# Patient Record
Sex: Female | Born: 1990 | Race: Black or African American | Hispanic: No | Marital: Married | Smoking: Never smoker
Health system: Southern US, Community
[De-identification: ages and names within clinical notes are randomized; demographics above are authoritative.]

## PROBLEM LIST (undated history)

## (undated) ENCOUNTER — Emergency Department (HOSPITAL_COMMUNITY): Discharge status: Absent without leave | Payer: BC Managed Care – PPO

## (undated) DIAGNOSIS — B019 Varicella without complication: Secondary | ICD-10-CM

## (undated) DIAGNOSIS — R011 Cardiac murmur, unspecified: Secondary | ICD-10-CM

## (undated) DIAGNOSIS — J45909 Unspecified asthma, uncomplicated: Secondary | ICD-10-CM

## (undated) DIAGNOSIS — T7840XA Allergy, unspecified, initial encounter: Secondary | ICD-10-CM

## (undated) HISTORY — PX: WISDOM TOOTH EXTRACTION: SHX21

## (undated) HISTORY — PX: TONSILLECTOMY AND ADENOIDECTOMY: SUR1326

## (undated) HISTORY — DX: Cardiac murmur, unspecified: R01.1

## (undated) HISTORY — DX: Allergy, unspecified, initial encounter: T78.40XA

## (undated) HISTORY — DX: Unspecified asthma, uncomplicated: J45.909

## (undated) HISTORY — DX: Varicella without complication: B01.9

---
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
## 2013-10-03 ENCOUNTER — Ambulatory Visit (INDEPENDENT_AMBULATORY_CARE_PROVIDER_SITE_OTHER): Payer: BC Managed Care – PPO | Admitting: Internal Medicine

## 2013-10-03 ENCOUNTER — Encounter: Payer: Self-pay | Admitting: Internal Medicine

## 2013-10-03 VITALS — BP 118/80 | Temp 98.4°F | Ht 62.0 in | Wt 174.0 lb

## 2013-10-03 DIAGNOSIS — M79609 Pain in unspecified limb: Secondary | ICD-10-CM

## 2013-10-03 DIAGNOSIS — M79606 Pain in leg, unspecified: Secondary | ICD-10-CM | POA: Insufficient documentation

## 2013-10-03 DIAGNOSIS — R51 Headache: Secondary | ICD-10-CM

## 2013-10-03 DIAGNOSIS — M25519 Pain in unspecified shoulder: Secondary | ICD-10-CM

## 2013-10-03 DIAGNOSIS — M25512 Pain in left shoulder: Secondary | ICD-10-CM

## 2013-10-03 DIAGNOSIS — R519 Headache, unspecified: Secondary | ICD-10-CM

## 2013-10-03 DIAGNOSIS — Z6831 Body mass index (BMI) 31.0-31.9, adult: Secondary | ICD-10-CM

## 2013-10-03 LAB — CBC WITH DIFFERENTIAL/PLATELET
Basophils Absolute: 0 10*3/uL (ref 0.0–0.1)
Basophils Relative: 0.3 % (ref 0.0–3.0)
Eosinophils Absolute: 0.1 10*3/uL (ref 0.0–0.7)
Eosinophils Relative: 1.7 % (ref 0.0–5.0)
HCT: 42.3 % (ref 36.0–46.0)
Hemoglobin: 13.9 g/dL (ref 12.0–15.0)
Lymphocytes Relative: 29.7 % (ref 12.0–46.0)
Lymphs Abs: 2.4 10*3/uL (ref 0.7–4.0)
MCHC: 33 g/dL (ref 30.0–36.0)
MCV: 92.8 fl (ref 78.0–100.0)
Monocytes Absolute: 0.4 10*3/uL (ref 0.1–1.0)
Monocytes Relative: 5 % (ref 3.0–12.0)
Neutro Abs: 5.1 10*3/uL (ref 1.4–7.7)
Neutrophils Relative %: 63.3 % (ref 43.0–77.0)
Platelets: 258 10*3/uL (ref 150.0–400.0)
RBC: 4.56 Mil/uL (ref 3.87–5.11)
RDW: 13.2 % (ref 11.5–15.5)
WBC: 8.1 10*3/uL (ref 4.0–10.5)

## 2013-10-03 LAB — HEPATIC FUNCTION PANEL
ALT: 12 U/L (ref 0–35)
AST: 23 U/L (ref 0–37)
Albumin: 4 g/dL (ref 3.5–5.2)
Alkaline Phosphatase: 63 U/L (ref 39–117)
Bilirubin, Direct: 0.1 mg/dL (ref 0.0–0.3)
Total Bilirubin: 0.6 mg/dL (ref 0.2–1.2)
Total Protein: 7.1 g/dL (ref 6.0–8.3)

## 2013-10-03 LAB — BASIC METABOLIC PANEL
BUN: 9 mg/dL (ref 6–23)
CO2: 28 mEq/L (ref 19–32)
Calcium: 9.8 mg/dL (ref 8.4–10.5)
Chloride: 105 mEq/L (ref 96–112)
Creatinine, Ser: 0.9 mg/dL (ref 0.4–1.2)
GFR: 105.09 mL/min (ref >60.00)
Glucose, Bld: 87 mg/dL (ref 70–99)
Potassium: 4.4 mEq/L (ref 3.5–5.1)
Sodium: 137 mEq/L (ref 135–145)

## 2013-10-03 LAB — LIPID PANEL
Cholesterol: 139 mg/dL (ref 0–200)
HDL: 43.5 mg/dL (ref >39.00)
LDL Cholesterol: 89 mg/dL (ref 0–99)
NONHDL: 95.5
Total CHOL/HDL Ratio: 3
Triglycerides: 33 mg/dL (ref 0.0–149.0)
VLDL: 6.6 mg/dL (ref 0.0–40.0)

## 2013-10-03 LAB — CK: Total CK: 174 U/L (ref 7–177)

## 2013-10-03 LAB — VITAMIN D 25 HYDROXY (VIT D DEFICIENCY, FRACTURES): VITD: 30.04 ng/mL (ref 30.00–100.00)

## 2013-10-03 LAB — HEMOGLOBIN A1C: Hgb A1c MFr Bld: 5.4 % (ref 4.6–6.5)

## 2013-10-03 NOTE — Progress Notes (Signed)
Pre visit review using our clinic review tool, if applicable. No additional management support is needed unless otherwise documented below in the visit note.   Chief Complaint  Patient presents with  . Leg Pain    Started 2 weeks ago.  Has seen another physician for this problem.  Has had labs, x-rays and all were normal.  Was told she needed to loose weight.  . Arm Pain  . Head Pain    HPI: [name] is a 23 year old daughter of one of our patients who hasn't been seen in over 4 years who comes in today for an acute problem. She's been living in [place] and attending [employer] and graduated in May. She's had medical care in [place]. She is currently moved back to [facility] and is to begin loss:  Central in the fall has an apartment in [place]. She states she's been in generally good health until about 2 weeks ago when she began to have a deep random aching pain like a bruise in her left thigh. States it moves up and down into her buttocks area. It is worse after sitting long periods of time and getting up and down. It is better with walking and exercising she is now beginning to have some in her right she is also had some left arm pain up near her shoulder .she is left handed dominant. Denies a specific injury  She saw a physician in [place]  Get thyroid tests an x-ray of the leg and it was felt that she had pain that would get better if she lost some weight. Since then she's developed what she calls a head pain not a headache random better with holding her head no vision changes sudden seconds of sharp pains with no associated symptoms.  Mom is worried because husband has MS there is no weakness numbness falling. Mom wants a vitamin D level done patient wants to know why she has this now and hasn't had before. Thyroid and xray negative.  Is using [employer]   Exercise 3 x per week.  Treadmill for 20 minutes . 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 ROS: See pertinent positives and negatives per HPI. No chest pain shortness of breath vision changes hearing changes. Neg gi gu otherwise  No tobacco   Past Medical History  Diagnosis Date  . Allergy   . Chicken pox   . Heart murmur   . Asthma     Family History  Problem Relation Age of Onset  . Multiple sclerosis Father   . Cancer Father     Leukemia and Prostate  . Hypertension Maternal Grandmother   . Alcohol abuse Maternal Grandfather     History   Social History  . Marital Status: Married    Spouse Name: N/A    Number of Children: N/A  . Years of Education: N/A   Social History Main Topics  . Smoking status: Never Smoker   . Smokeless tobacco: Not on file  . Alcohol Use: No  . Drug Use: No  . Sexual Activity: No   Other Topics Concern  . 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    EXAM:  BP 118/80  Temp(Src) 98.4 F (36.9 C) (Oral)  Ht 5\' 2"  (1.575 m)  Wt 174 lb (78.926 kg)  BMI 31.82 kg/m2  Body mass index is 31.82 kg/(m^2).  GENERAL: vitals reviewed and listed above, alert, oriented, appears well hydrated and in no acute distress she looks well HEENT: atraumatic, conjunctiva  clear, no obvious abnormalities on inspection of external nose and ears EOMs full PERRLA OP : no lesion edema or exudate  NECK: no obvious masses on inspection palpation no adenopathy LUNGS: clear to auscultation bilaterally, no wheezes, rales or rhonchi, good air movement CV: HRRR, no gallops I hear no murmurs no clubbing cyanosis or  peripheral edema nl cap refill  MS: moves all extremities without noticeable focal   abnormality no point tenderness PSYCH:  no obvious depression or anxiety Extremities no acute findings some tenderness over the left shoulder rotator cuff but good range of motion and good strength Neurologic cranial nerves III through XII appear intact no focal deficits gait is within normal limits good range of motion of back she does have a lordosis when she stands no focal weakness Skin no acute findings  ASSESSMENT AND PLAN:  Discussed the following assessment and plan:  Leg pain, anterior, unspecified laterality - Left more than right worse with sitting better with exercise - Plan: CK, Basic metabolic panel, CBC with Differential, Hemoglobin A1c, Hepatic function panel, Vit D  25 hydroxy (rtn osteoporosis monitoring), Lipid panel, CANCELED: Lipase  Left shoulder pain - Probable tendinitis is left-handed dominant - Plan: CK, Basic metabolic panel, CBC with Differential, Hemoglobin A1c, Hepatic function panel, Vit D  25 hydroxy (rtn osteoporosis monitoring), Lipid panel, CANCELED: Lipase  Head pain - Sounds like extra cranial with no other alarm symptoms  BMI 31.0-31.9,adult I think she could have femoral  cutaneous nerve syndrome compression ;today she is wearing skinny jeans but says she doesn't wear them very often but told her that any compression could cause this syndrome especially since it is worse when she's sitting alternatively it could be radiating from her back but is still sounds mechanical. At this time no alarm features however because only thyroid tests were done by report we can do blood sugar lipids anemia check. Advise exercise at this time and continue with her efforts to be healthy and lose weight back hygiene important if this is persistent and progressive she should followup here or elsewhere perhaps sports medicine in regard to her discomfort. At this time I do not think she has a neurologic or inflammatory rheumatic disease. -Patient advised to return or notify health  care team  if symptoms worsen ,persist or new concerns arise.  Patient Instructions  This acts  Like muscular skeletal pain  . And or  Lateral cutaneous nerve compression possible or radiating from back Either way  Advise exercise  Upright avoid prolonged sitting  And heavy lifting.  Will notify you  of labs when available. No evidence at this time of systemic disease process.  [plan fu if  persistent or progressive and not improving over the next months  Or so    Back Exercises These exercises may help you when beginning to rehabilitate your injury. Your symptoms may resolve with or without further involvement from your physician, physical therapist or athletic trainer. While completing these exercises, remember:   Restoring tissue flexibility  helps normal motion to return to the joints. This allows healthier, less painful movement and activity.  An effective stretch should be held for at least 30 seconds.  A stretch should never be painful. You should only feel a gentle lengthening or release in the stretched tissue. STRETCH - Extension, Prone on Elbows   Lie on your stomach on the floor, a bed will be too soft. Place your palms about shoulder width apart and at the height of your head.  Place your elbows under your shoulders. If this is too painful, stack pillows under your chest.  Allow your body to relax so that your hips drop lower and make contact more completely with the floor.  Hold this position for __________ seconds.  Slowly return to lying flat on the floor. Repeat __________ times. Complete this exercise __________ times per day.  RANGE OF MOTION - Extension, Prone Press Ups   Lie on your stomach on the floor, a bed will be too soft. Place your palms about shoulder width apart and at the height of your head.  Keeping your back as relaxed as possible, slowly straighten your elbows while keeping your hips on the floor. You may adjust the placement of your hands to  maximize your comfort. As you gain motion, your hands will come more underneath your shoulders.  Hold this position __________ seconds.  Slowly return to lying flat on the floor. Repeat __________ times. Complete this exercise __________ times per day.  RANGE OF MOTION- Quadruped, Neutral Spine   Assume a hands and knees position on a firm surface. Keep your hands under your shoulders and your knees under your hips. You may place padding under your knees for comfort.  Drop your head and point your tail bone toward the ground below you. This will round out your low back like an angry cat. Hold this position for __________ seconds.  Slowly lift your head and release your tail bone so that your back sags into a large arch, like an old horse.  Hold this position for __________ seconds.  Repeat this until you feel limber in your low back.  Now, find your "sweet spot." This will be the most comfortable position somewhere between the two previous positions. This is your neutral spine. Once you have found this position, tense your stomach muscles to support your low back.  Hold this position for __________ seconds. Repeat __________ times. Complete this exercise __________ times per day.  STRETCH - Flexion, Single Knee to Chest   Lie on a firm bed or floor with both legs extended in front of you.  Keeping one leg in contact with the floor, bring your opposite knee to your chest. Hold your leg in place by either grabbing behind your thigh or at your knee.  Pull until you feel a gentle stretch in your low back. Hold __________ seconds.  Slowly release your grasp and repeat the exercise with the opposite side. Repeat __________ times. Complete this exercise __________ times per day.  STRETCH - Hamstrings, Standing  Stand or sit and extend your right / left leg, placing your foot on a chair or foot stool  Keeping a slight arch in your low back and your hips straight forward.  Lead with your  chest and lean forward at the waist until you feel a gentle stretch in the back of your right / left knee or thigh. (When done correctly, this exercise requires leaning only a small distance.)  Hold this position for __________  seconds. Repeat __________ times. Complete this stretch __________ times per day. STRENGTHENING - Deep Abdominals, Pelvic Tilt   Lie on a firm bed or floor. Keeping your legs in front of you, bend your knees so they are both pointed toward the ceiling and your feet are flat on the floor.  Tense your lower abdominal muscles to press your low back into the floor. This motion will rotate your pelvis so that your tail bone is scooping upwards rather than pointing at your feet or into the floor.  With a gentle tension and even breathing, hold this position for __________ seconds. Repeat __________ times. Complete this exercise __________ times per day.  STRENGTHENING - Abdominals, Crunches   Lie on a firm bed or floor. Keeping your legs in front of you, bend your knees so they are both pointed toward the ceiling and your feet are flat on the floor. Cross your arms over your chest.  Slightly tip your chin down without bending your neck.  Tense your abdominals and slowly lift your trunk high enough to just clear your shoulder blades. Lifting higher can put excessive stress on the low back and does not further strengthen your abdominal muscles.  Control your return to the starting position. Repeat __________ times. Complete this exercise __________ times per day.  STRENGTHENING - Quadruped, Opposite UE/LE Lift   Assume a hands and knees position on a firm surface. Keep your hands under your shoulders and your knees under your hips. You may place padding under your knees for comfort.  Find your neutral spine and gently tense your abdominal muscles so that you can maintain this position. Your shoulders and hips should form a rectangle that is parallel with the floor and is not  twisted.  Keeping your trunk steady, lift your right hand no higher than your shoulder and then your left leg no higher than your hip. Make sure you are not holding your breath. Hold this position __________ seconds.  Continuing to keep your abdominal muscles tense and your back steady, slowly return to your starting position. Repeat with the opposite arm and leg. Repeat __________ times. Complete this exercise __________ times per day. Document Released: 03/17/2005 Document Revised: 05/22/2011 Document Reviewed: 06/11/2008 [facility] Patient Information 2015 [place], [place]. This information is not intended to replace advice given to you by your health care provider. Make sure you discuss any questions you have with your health care provider.      Neta Mends. Panosh M.D.

## 2013-10-03 NOTE — Patient Instructions (Addendum)
This acts  Like muscular skeletal pain  . And or  Lateral cutaneous nerve compression possible or radiating from back Either way  Advise exercise  Upright avoid prolonged sitting  And heavy lifting.  Will notify you  of labs when available. No evidence at this time of systemic disease process.  [plan fu if  persistent or progressive and not improving over the next months  Or so    Back Exercises These exercises may help you when beginning to rehabilitate your injury. Your symptoms may resolve with or without further involvement from your physician, physical therapist or athletic trainer. While completing these exercises, remember:   Restoring tissue flexibility helps normal motion to return to the joints. This allows healthier, less painful movement and activity.  An effective stretch should be held for at least 30 seconds.  A stretch should never be painful. You should only feel a gentle lengthening or release in the stretched tissue. STRETCH - Extension, Prone on Elbows   Lie on your stomach on the floor, a bed will be too soft. Place your palms about shoulder width apart and at the height of your head.  Place your elbows under your shoulders. If this is too painful, stack pillows under your chest.  Allow your body to relax so that your hips drop lower and make contact more completely with the floor.  Hold this position for __________ seconds.  Slowly return to lying flat on the floor. Repeat __________ times. Complete this exercise __________ times per day.  RANGE OF MOTION - Extension, Prone Press Ups   Lie on your stomach on the floor, a bed will be too soft. Place your palms about shoulder width apart and at the height of your head.  Keeping your back as relaxed as possible, slowly straighten your elbows while keeping your hips on the floor. You may adjust the placement of your hands to maximize your comfort. As you gain motion, your hands will come more underneath your  shoulders.  Hold this position __________ seconds.  Slowly return to lying flat on the floor. Repeat __________ times. Complete this exercise __________ times per day.  RANGE OF MOTION- Quadruped, Neutral Spine   Assume a hands and knees position on a firm surface. Keep your hands under your shoulders and your knees under your hips. You may place padding under your knees for comfort.  Drop your head and point your tail bone toward the ground below you. This will round out your low back like an angry cat. Hold this position for __________ seconds.  Slowly lift your head and release your tail bone so that your back sags into a large arch, like an old horse.  Hold this position for __________ seconds.  Repeat this until you feel limber in your low back.  Now, find your "sweet spot." This will be the most comfortable position somewhere between the two previous positions. This is your neutral spine. Once you have found this position, tense your stomach muscles to support your low back.  Hold this position for __________ seconds. Repeat __________ times. Complete this exercise __________ times per day.  STRETCH - Flexion, Single Knee to Chest   Lie on a firm bed or floor with both legs extended in front of you.  Keeping one leg in contact with the floor, bring your opposite knee to your chest. Hold your leg in place by either grabbing behind your thigh or at your knee.  Pull until you feel a gentle stretch in  your low back. Hold __________ seconds.  Slowly release your grasp and repeat the exercise with the opposite side. Repeat __________ times. Complete this exercise __________ times per day.  STRETCH - Hamstrings, Standing  Stand or sit and extend your right / left leg, placing your foot on a chair or foot stool  Keeping a slight arch in your low back and your hips straight forward.  Lead with your chest and lean forward at the waist until you feel a gentle stretch in the back of  your right / left knee or thigh. (When done correctly, this exercise requires leaning only a small distance.)  Hold this position for __________ seconds. Repeat __________ times. Complete this stretch __________ times per day. STRENGTHENING - Deep Abdominals, Pelvic Tilt   Lie on a firm bed or floor. Keeping your legs in front of you, bend your knees so they are both pointed toward the ceiling and your feet are flat on the floor.  Tense your lower abdominal muscles to press your low back into the floor. This motion will rotate your pelvis so that your tail bone is scooping upwards rather than pointing at your feet or into the floor.  With a gentle tension and even breathing, hold this position for __________ seconds. Repeat __________ times. Complete this exercise __________ times per day.  STRENGTHENING - Abdominals, Crunches   Lie on a firm bed or floor. Keeping your legs in front of you, bend your knees so they are both pointed toward the ceiling and your feet are flat on the floor. Cross your arms over your chest.  Slightly tip your chin down without bending your neck.  Tense your abdominals and slowly lift your trunk high enough to just clear your shoulder blades. Lifting higher can put excessive stress on the low back and does not further strengthen your abdominal muscles.  Control your return to the starting position. Repeat __________ times. Complete this exercise __________ times per day.  STRENGTHENING - Quadruped, Opposite UE/LE Lift   Assume a hands and knees position on a firm surface. Keep your hands under your shoulders and your knees under your hips. You may place padding under your knees for comfort.  Find your neutral spine and gently tense your abdominal muscles so that you can maintain this position. Your shoulders and hips should form a rectangle that is parallel with the floor and is not twisted.  Keeping your trunk steady, lift your right hand no higher than your  shoulder and then your left leg no higher than your hip. Make sure you are not holding your breath. Hold this position __________ seconds.  Continuing to keep your abdominal muscles tense and your back steady, slowly return to your starting position. Repeat with the opposite arm and leg. Repeat __________ times. Complete this exercise __________ times per day. Document Released: 03/17/2005 Document Revised: 05/22/2011 Document Reviewed: 06/11/2008 [facility]ExitCare Patient Information 2015 [place], [place]. This information is not intended to replace advice given to you by your health care provider. Make sure you discuss any questions you have with your health care provider.
